# Patient Record
Sex: Female | Born: 1995 | Race: Black or African American | Hispanic: No | Marital: Single | Smoking: Former smoker
Health system: Southern US, Community
[De-identification: ages and names within clinical notes are randomized; demographics above are authoritative.]

## PROBLEM LIST (undated history)

## (undated) ENCOUNTER — Inpatient Hospital Stay (HOSPITAL_COMMUNITY): Payer: Self-pay

## (undated) DIAGNOSIS — J45909 Unspecified asthma, uncomplicated: Secondary | ICD-10-CM

## (undated) DIAGNOSIS — F419 Anxiety disorder, unspecified: Secondary | ICD-10-CM

## (undated) HISTORY — PX: DENTAL EXAMINATION UNDER ANESTHESIA: SHX1447

## (undated) HISTORY — PX: WISDOM TOOTH EXTRACTION: SHX21

---
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
## 2013-04-04 ENCOUNTER — Encounter (HOSPITAL_COMMUNITY): Payer: Self-pay | Admitting: *Deleted

## 2013-08-07 IMAGING — US US OB COMP LESS 14 WK
1 series · 13 of 28 positions shown · non-contrast
Comparison: none

[Series 1: us ob comp less 14 wks · 41 acquisitions, 13 frames shown]
[im 2/41]
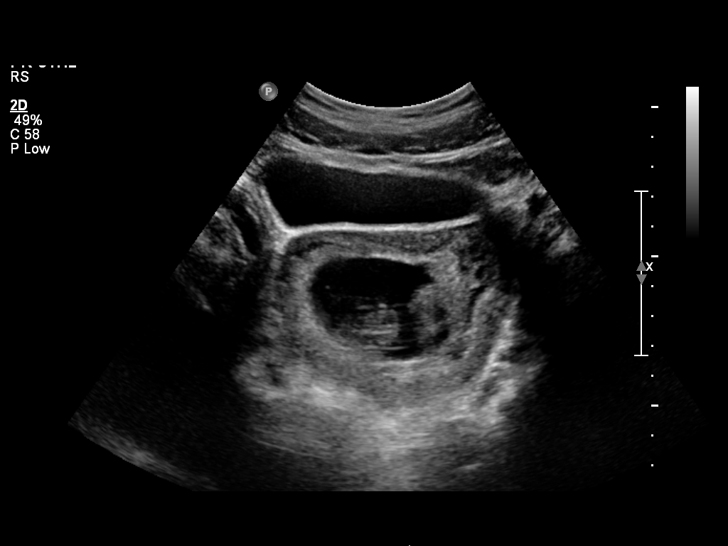
[im 5/41]
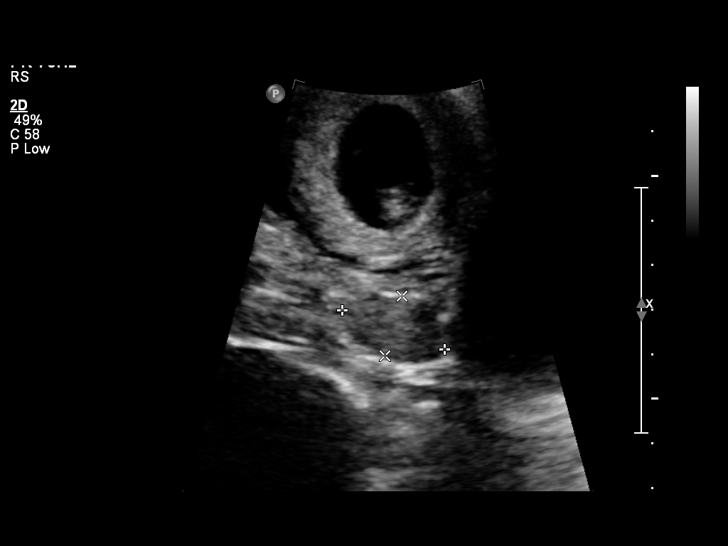
[im 8/41]
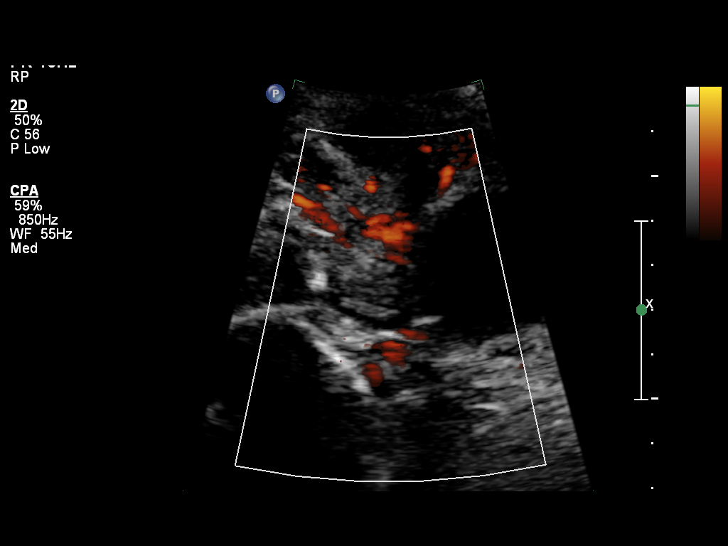
[im 11/41]
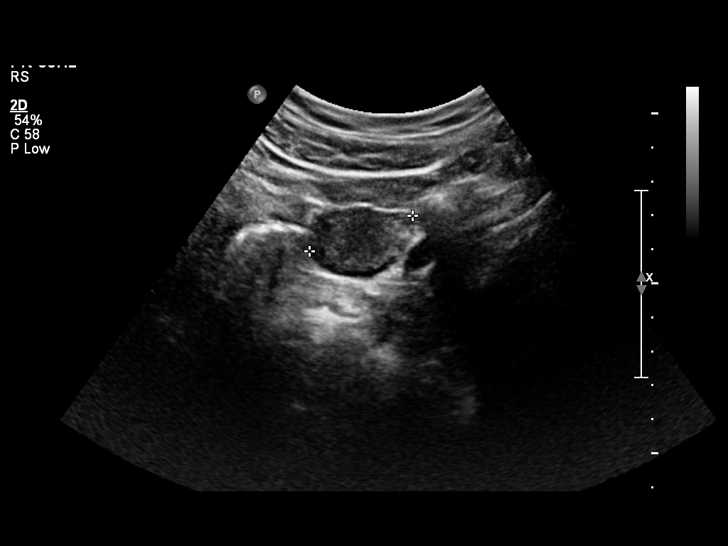
[im 14/41]
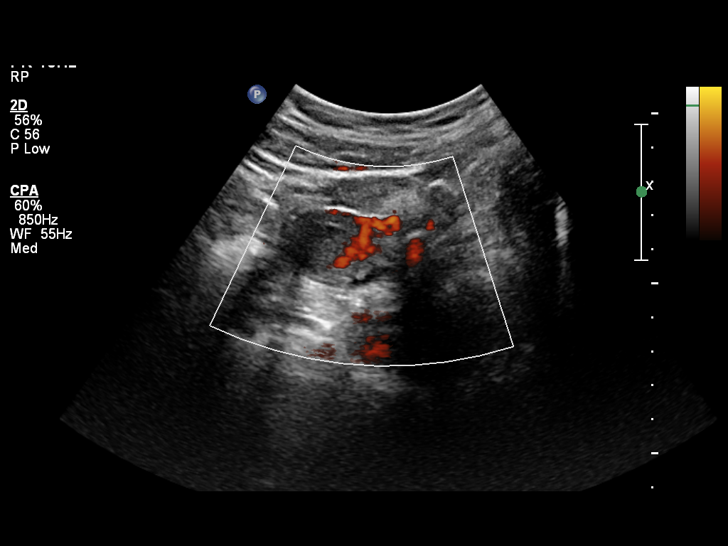
[im 17/41]
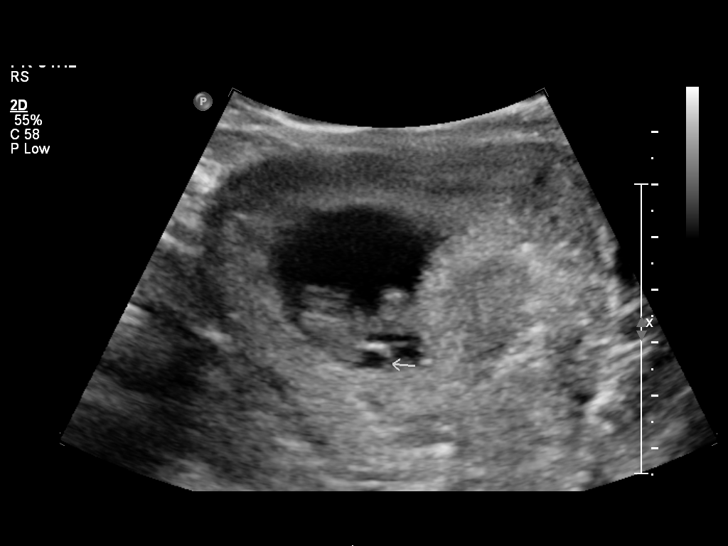
[im 21/41]
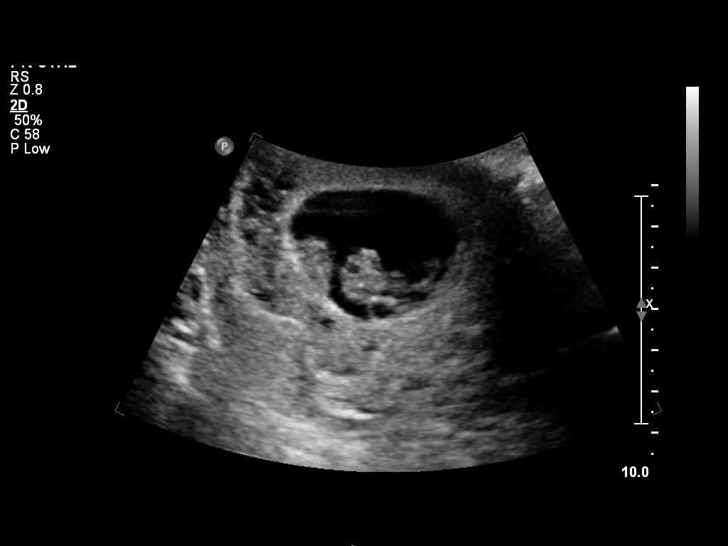
[im 24/41]
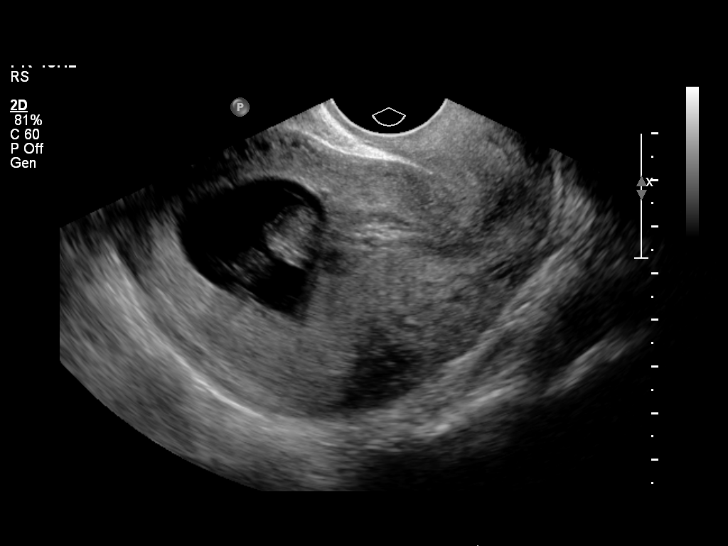
[im 27/41]
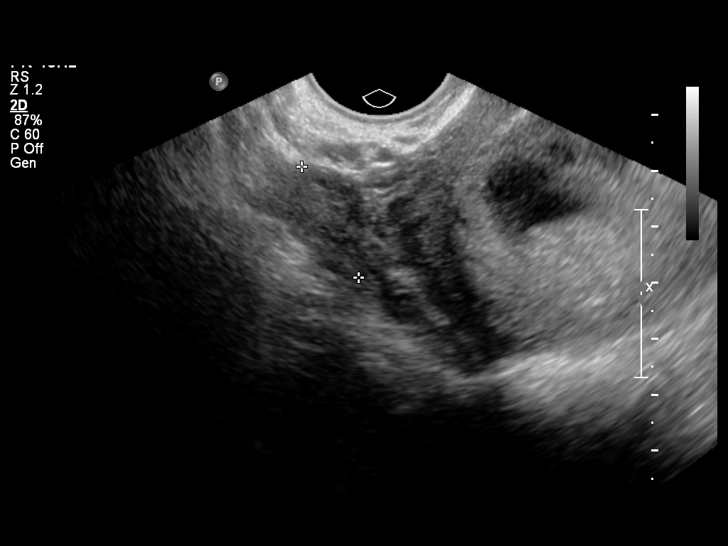
[im 30/41]
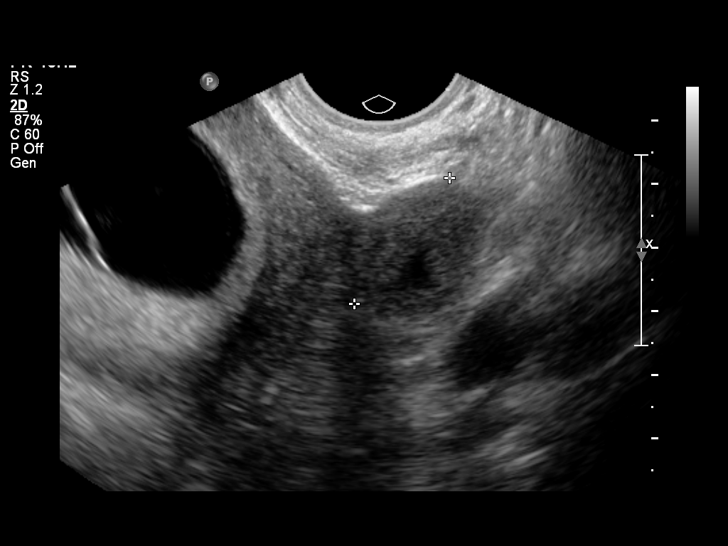
[im 33/41]
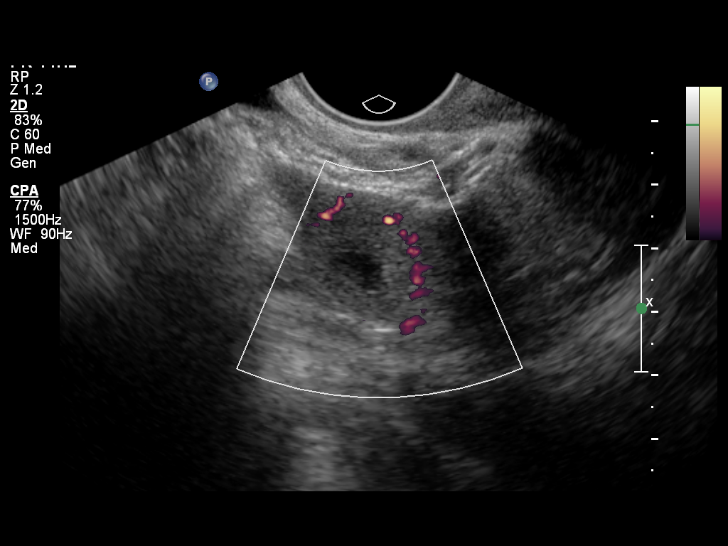
[im 36/41]
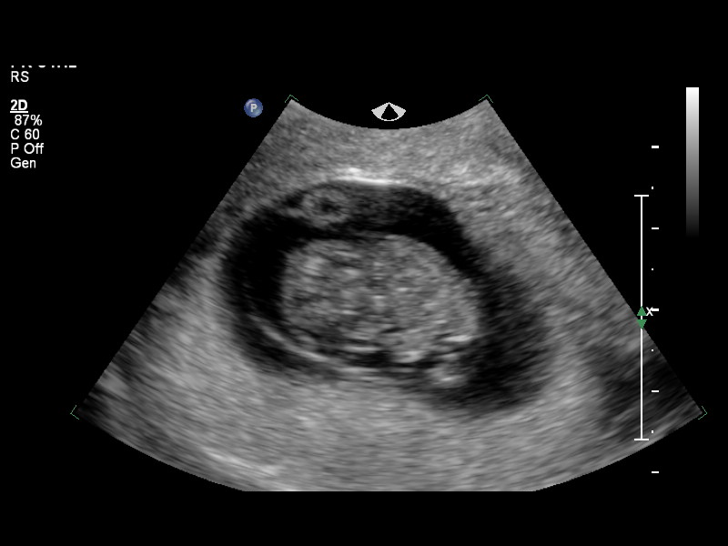
[im 39/41]
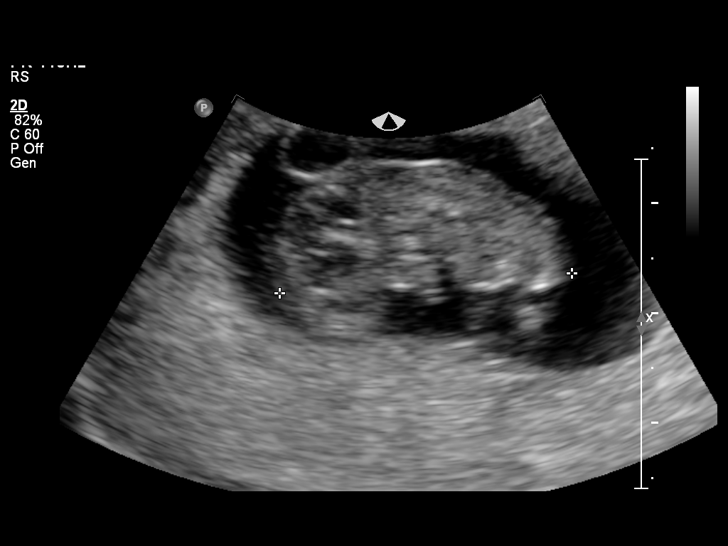

[13 of 28 positions shown; findings below may reference images not displayed]

OBSTETRICS REPORT
                    (Corrected Final 05/31/2012 [DATE])

Service(s) Provided

 US OB COMP LESS 14 WKS                                76801.0
 US OB TRANSVAGINAL                                    76817.0
Indications

 Uncertain LMP;  Establish Gestational [AGE]
 Pregnancy with inconclusive fetal viability
Fetal Evaluation

 Num Of Fetuses:    1
 Preg. Location:    Intrauterine
 Gest. Sac:         Intrauterine
 Yolk Sac:          Visualized
 Fetal Pole:        Visualized
 Fetal Heart Rate:  181                          bpm
 Cardiac Activity:  Observed
Biometry

 CRL:     27.7  mm     G. Age:  9w 3d                  EDD:    12/30/12
Gestational Age

 Best:          9w 3d      Det. By:  U/S C R L (05/30/12)     EDD:   12/30/12
Cervix Uterus Adnexa

 Cervix:       Normal appearance by transabdominal scan.
 Uterus:       No abnormality visualized.
 Left Ovary:    Size(cm) L: 3.35 x W: 3.19 x H: 2.06  Volume(cc):
                11.5- Small corpus luteum noted.
 Right Ovary:   Size(cm) L: 2.46 x W: 2.16 x H: 1.4  Volume(cc):
 Adnexa:     No abnormality visualized.
Impression

 Single living IUP with US Gest. Age of 9w 3d, and EDD of
 12/30/2012.
 No significant maternal uterine or adnexal abnormality
 identified.
 questions or concerns.
                 Attending Physician, ALAIN DANIEL

## 2014-03-04 ENCOUNTER — Encounter (HOSPITAL_COMMUNITY): Payer: Self-pay | Admitting: *Deleted

## 2018-01-16 ENCOUNTER — Inpatient Hospital Stay (HOSPITAL_COMMUNITY): Payer: 59

## 2018-01-16 ENCOUNTER — Encounter: Payer: Self-pay | Admitting: Obstetrics and Gynecology

## 2018-01-16 ENCOUNTER — Inpatient Hospital Stay (HOSPITAL_COMMUNITY)
Admission: AD | Admit: 2018-01-16 | Discharge: 2018-01-16 | Disposition: A | Discharge status: Home / Self Care | Payer: 59 | Source: Ambulatory Visit | Attending: Obstetrics & Gynecology | Admitting: Obstetrics & Gynecology

## 2018-01-16 ENCOUNTER — Other Ambulatory Visit: Payer: Self-pay | Admitting: Obstetrics and Gynecology

## 2018-01-16 ENCOUNTER — Encounter (HOSPITAL_COMMUNITY): Payer: Self-pay | Admitting: *Deleted

## 2018-01-16 DIAGNOSIS — N760 Acute vaginitis: Secondary | ICD-10-CM

## 2018-01-16 DIAGNOSIS — Z87891 Personal history of nicotine dependence: Secondary | ICD-10-CM | POA: Diagnosis not present

## 2018-01-16 DIAGNOSIS — O209 Hemorrhage in early pregnancy, unspecified: Secondary | ICD-10-CM | POA: Insufficient documentation

## 2018-01-16 DIAGNOSIS — B9689 Other specified bacterial agents as the cause of diseases classified elsewhere: Secondary | ICD-10-CM

## 2018-01-16 DIAGNOSIS — O98311 Other infections with a predominantly sexual mode of transmission complicating pregnancy, first trimester: Secondary | ICD-10-CM | POA: Insufficient documentation

## 2018-01-16 DIAGNOSIS — O3680X0 Pregnancy with inconclusive fetal viability, not applicable or unspecified: Secondary | ICD-10-CM | POA: Insufficient documentation

## 2018-01-16 DIAGNOSIS — A5901 Trichomonal vulvovaginitis: Secondary | ICD-10-CM | POA: Diagnosis not present

## 2018-01-16 DIAGNOSIS — O23591 Infection of other part of genital tract in pregnancy, first trimester: Secondary | ICD-10-CM | POA: Insufficient documentation

## 2018-01-16 DIAGNOSIS — O283 Abnormal ultrasonic finding on antenatal screening of mother: Secondary | ICD-10-CM

## 2018-01-16 DIAGNOSIS — N939 Abnormal uterine and vaginal bleeding, unspecified: Secondary | ICD-10-CM | POA: Diagnosis present

## 2018-01-16 DIAGNOSIS — Z3A01 Less than 8 weeks gestation of pregnancy: Secondary | ICD-10-CM | POA: Insufficient documentation

## 2018-01-16 LAB — WET PREP, GENITAL
SPERM: NONE SEEN
Yeast Wet Prep HPF POC: NONE SEEN

## 2018-01-16 LAB — CBC
HEMATOCRIT: 37.6 % (ref 36.0–46.0)
Hemoglobin: 12.3 g/dL (ref 12.0–15.0)
MCH: 28.9 pg (ref 26.0–34.0)
MCHC: 32.7 g/dL (ref 30.0–36.0)
MCV: 88.5 fL (ref 78.0–100.0)
Platelets: 336 10*3/uL (ref 150–400)
RBC: 4.25 MIL/uL (ref 3.87–5.11)
RDW: 15.6 % — ABNORMAL HIGH (ref 11.5–15.5)
WBC: 9.1 10*3/uL (ref 4.0–10.5)

## 2018-01-16 LAB — POCT PREGNANCY, URINE: Preg Test, Ur: POSITIVE — AB

## 2018-01-16 LAB — URINALYSIS, ROUTINE W REFLEX MICROSCOPIC
BACTERIA UA: NONE SEEN
Bilirubin Urine: NEGATIVE
Glucose, UA: NEGATIVE mg/dL
Ketones, ur: NEGATIVE mg/dL
NITRITE: NEGATIVE
PH: 8 (ref 5.0–8.0)
Protein, ur: >=300 mg/dL — AB
RBC / HPF: >50 RBC/hpf — ABNORMAL HIGH (ref 0–5)
SPECIFIC GRAVITY, URINE: 1.02 (ref 1.005–1.030)

## 2018-01-16 LAB — HCG, QUANTITATIVE, PREGNANCY: hCG, Beta Chain, Quant, S: 49989 m[IU]/mL — ABNORMAL HIGH (ref <5)

## 2018-01-16 MED ORDER — METRONIDAZOLE 500 MG PO TABS
2000.0000 mg | ORAL_TABLET | Freq: Once | ORAL | Status: AC
  Administered 2018-01-16: 2000 mg via ORAL
  Filled 2018-01-16: qty 4

## 2018-01-16 NOTE — Discharge Instructions (Signed)
Expedited Partner Therapy:  °Information Sheet for Patients and Partners  °            ° °You have been offered expedited partner therapy (EPT). This information sheet contains important information and warnings you need to be aware of, so please read it carefully.  ° °Expedited Partner Therapy (EPT) is the clinical practice of treating the sexual partners of persons who receive chlamydia, gonorrhea, or trichomoniasis diagnoses by providing medications or prescriptions to the patient. Patients then provide partners with these therapies without the health-care provider having examined the partner. In other words, EPT is a convenient, fast and private way for patients to help their sexual partners get treated.  ° °Chlamydia and gonorrhea are bacterial infections you get from having sex with a person who is already infected. Trichomoniasis (or “trich”) is a very common sexually transmitted infection (STI) that is caused by infection with a protozoan parasite called Trichomonas vaginalis.  Many people with these infections don’t know it because they feel fine, but without treatment these infections can cause serious health problems, such as pelvic inflammatory disease, ectopic pregnancy, infertility and increased risk of HIV.  ° °It is important to get treated as soon as possible to protect your health, to avoid spreading these infections to others, and to prevent yourself from becoming re-infected. The good news is these infections can be easily cured with proper antibiotic medicine. The best way to take care of your self is to see a doctor or go to your local health department. If you are not able to see a doctor or other medical provider, you should take EPT.  ° ° °Recommended Medication: °EPT for Chlamydia:  Azithromycin (Zithromax) 1 gram orally in a single dose °EPT for Gonorrhea:  Cefixime (Suprax) 400 milligrams orally in a single dose PLUS azithromycin (Zithromax) 1 gram orally in a single dose °EPT for  Trichomoniasis:  Metronidazole (Flagyl) 2 grams orally in a single dose ° ° °These medicines are very safe. However, you should not take them if you have ever had an allergic reaction (like a rash) to any of these medicines: azithromycin (Zithromax), erythromycin, clarithromycin (Biaxin), metronidazole (Flagyl), tinidazole (Tindimax). If you are uncertain about whether you have an allergy, call your medical provider or pharmacist before taking this medicine. If you have a serious, long-term illness like kidney, liver or heart disease, colitis or stomach problems, or you are currently taking other prescription medication, talk to your provider before taking this medication.  ° °Women: If you have lower belly pain, pain during sex, vomiting, or a fever, do not take this medicine. Instead, you should see a medical provider to be certain you do not have pelvic inflammatory disease (PID). PID can be serious and lead to infertility, pregnancy problems or chronic pelvic pain.  ° °Pregnant Women: It is very important for you to see a doctor to get pregnancy services and pre-natal care. These antibiotics for EPT are safe for pregnant women, but you still need to see a medical provider as soon as possible. It is also important to note that Doxycycline is an alternative therapy for chlamydia, but it should not be taken by someone who is pregnant.  ° °Men: If you have pain or swelling in the testicles or a fever, do not take this medicine and see a medical provider.    ° °Men who have sex with men (MSM): MSM in [place] continue to experience high rates of syphilis and HIV. Many MSM with gonorrhea or   chlamydia could also have syphilis and/or HIV and not know it. If you are a man who has sex with other men, it is very important that you see a medical provider and are tested for HIV and syphilis. EPT is not recommended for gonorrhea for MSM.  Recommended treatment for gonorrhea for MSM is Rocephin (shot) AND azithromycin  due to decreased cure rate.  Please see your medical provider if this is the case.   ° °Along with this information sheet is a prescription for the medicine. If you receive a prescription it will be in your name and will indicate your date of birth, or it will be in the name of “Expedited Partner Therapy”.   In either case, you can have the prescription filled at a pharmacy. You will be responsible for the cost of the medicine, unless you have prescription drug coverage. In that case, you could provide your name so the pharmacy could bill your health plan.  ° °Take the medication as directed. Some people will have a mild, upset stomach, which does not last long. AVOID alcohol 24 hours after taking metronidazole (Flagyl) to reduce the possibility of a disulfiram-like reaction (severe vomiting and abdominal pain).  After taking the medicine, do not have sex for 7 days. Do not share this medicine or give it to anyone else. It is important to tell everyone you have had sex with in the last 60 days that they need to go and get tested for sexually transmitted infections.  ° °Ways to prevent these and other sexually transmitted infections (STIs):  ° °• Abstain from sex. This is the only sure way to avoid getting an STI.  °• Use barrier methods, such as condoms, consistently and correctly.  °• Limit the number of sexual partners.  °• Have regular physical exams, including testing for STIs.  ° °For more information about EPT or other issues pertaining to an STI, please contact your medical provider or the [place] [facility] at [phone] or [email or web address]   ° °

## 2018-01-16 NOTE — MAU Note (Signed)
Patient reports waking up in the middle of the night "in a pool of blood" and cramping.  States passing several dime sized clots as well as a few larger ones.  LMP was 12/03/17.  Pt reports taking a +HPT.

## 2018-01-16 NOTE — MAU Provider Note (Signed)
History     CSN: [id number]  Arrival date and time: 01/16/18 4098   First Provider Initiated Contact with Patient 01/16/18 1010      Chief Complaint  Patient presents with  . Vaginal Bleeding  . Abdominal Pain   HPI  Ms.  Maureen Pierce is a 22 y.o. year old G15P0010 female at [redacted]w[redacted]d weeks gestation who presents to MAU reporting awakening in the middle of the night "in a pool of blood" and cramping. She reports passing several dime sized clots as well as a few larger ones. LMP was 12/03/17. She reports she had 3 (+) HPTs.  Past Medical History:  Diagnosis Date  . Anxiety   . Asthma     Past Surgical History:  Procedure Laterality Date  . DENTAL EXAMINATION UNDER ANESTHESIA    . WISDOM TOOTH EXTRACTION      Family History  Problem Relation Age of Onset  . Other Neg Hx     Social History   Tobacco Use  . Smoking status: Former Smoker    Types: Cigarettes  . Smokeless tobacco: Never Used  Substance Use Topics  . Alcohol use: Yes    Comment: occasionally  . Drug use: No    Allergies: No Known Allergies  Medications Prior to Admission  Medication Sig Dispense Refill Last Dose  . Ascorbic Acid (VITAMIN C PO) Take 1 tablet by mouth daily.   05/29/2012 at Unknown  . fexofenadine (ALLEGRA) 180 MG tablet Take 180 mg by mouth daily as needed. For allergies   Past Week at Unknown  . metoCLOPramide (REGLAN) 10 MG tablet Take 1 tablet (10 mg total) by mouth 4 (four) times daily. 60 tablet 1   . metroNIDAZOLE (FLAGYL) 500 MG tablet Take 1 tablet (500 mg total) by mouth 2 (two) times daily. 14 tablet 0     Review of Systems  Constitutional: Negative.   HENT: Negative.   Eyes: Negative.   Respiratory: Negative.   Cardiovascular: Negative.   Gastrointestinal: Positive for abdominal pain.  Endocrine: Negative.   Genitourinary: Positive for vaginal bleeding.  Musculoskeletal: Negative.   Skin: Negative.   Allergic/Immunologic: Negative.   Neurological: Negative.    Hematological: Negative.   Psychiatric/Behavioral: Negative.    Physical Exam   Blood pressure 140/86, pulse 100, temperature 98.5 F (36.9 C), temperature source Oral, resp. rate 18, weight 84.4 kg, last menstrual period 12/03/2017.  Physical Exam  Nursing note and vitals reviewed. Constitutional: She is oriented to person, place, and time. She appears well-developed and well-nourished.  HENT:  Head: Normocephalic and atraumatic.  Eyes: Pupils are equal, round, and reactive to light.  Neck: Normal range of motion.  Cardiovascular: Normal rate, regular rhythm, normal heart sounds and intact distal pulses.  Respiratory: Effort normal and breath sounds normal.  GI: Soft. Bowel sounds are normal.  Genitourinary:  Genitourinary Comments: Uterus: non-tender, SE: cervix is smooth, pink, no lesions, small amt of dark, red blood in vaginal vault -- WP, GC/CT done, closed/long/firm, no CMT or friability, no adnexal tenderness   Musculoskeletal: Normal range of motion.  Neurological: She is alert and oriented to person, place, and time.  Skin: Skin is warm and dry.  Psychiatric: She has a normal mood and affect. Her behavior is normal. Judgment and thought content normal.    MAU Course  Procedures  MDM [facility] UPT CBC w/Diff ABO/Rh HCG Wet Prep GC/CT -- pending HIV -- pending OB < 14 wks [place] with TV Flagyl 2000 mg po prior to d/c --  Results for orders placed or performed during the hospital encounter of 01/16/18 (from the past 24 hour(s))  Urinalysis, Routine w reflex microscopic     Status: Abnormal   Collection Time: 01/16/18  9:28 AM  Result Value Ref Range   Color, Urine YELLOW YELLOW   APPearance CLOUDY (A) CLEAR   Specific Gravity, Urine 1.020 1.005 - 1.030   pH 8.0 5.0 - 8.0   Glucose, UA NEGATIVE NEGATIVE mg/dL   Hgb urine dipstick LARGE (A) NEGATIVE   Bilirubin Urine NEGATIVE NEGATIVE   Ketones, ur NEGATIVE NEGATIVE mg/dL   Protein, ur >=161>=300 (A) NEGATIVE mg/dL    Nitrite NEGATIVE NEGATIVE   Leukocytes, UA TRACE (A) NEGATIVE   RBC / HPF >50 (H) 0 - 5 RBC/hpf   WBC, UA 6-10 0 - 5 WBC/hpf   Bacteria, UA NONE SEEN NONE SEEN   Squamous Epithelial / LPF 21-50 0 - 5   Mucus PRESENT   Pregnancy, urine POC     Status: Abnormal   Collection Time: 01/16/18  9:36 AM  Result Value Ref Range   Preg Test, Ur POSITIVE (A) NEGATIVE  CBC     Status: Abnormal   Collection Time: 01/16/18 10:28 AM  Result Value Ref Range   WBC 9.1 4.0 - 10.5 K/uL   RBC 4.25 3.87 - 5.11 MIL/uL   Hemoglobin 12.3 12.0 - 15.0 g/dL   HCT 09.637.6 04.536.0 - 40.946.0 %   MCV 88.5 78.0 - 100.0 fL   MCH 28.9 26.0 - 34.0 pg   MCHC 32.7 30.0 - 36.0 g/dL   RDW 81.115.6 (H) 91.411.5 - 78.215.5 %   Platelets 336 150 - 400 K/uL  hCG, quantitative, pregnancy     Status: Abnormal   Collection Time: 01/16/18 10:28 AM  Result Value Ref Range   hCG, Beta Chain, Quant, S 49,989 (H) <5 mIU/mL  Wet prep, genital     Status: Abnormal   Collection Time: 01/16/18 11:01 AM  Result Value Ref Range   Yeast Wet Prep HPF POC NONE SEEN NONE SEEN   Trich, Wet Prep PRESENT (A) NONE SEEN   Clue Cells Wet Prep HPF POC PRESENT (A) NONE SEEN   WBC, Wet Prep HPF POC FEW (A) NONE SEEN   Sperm NONE SEEN     [place] Ob Less Than 14 Weeks With Ob Transvaginal  Result Date: 01/16/2018 CLINICAL DATA:  Vaginal bleeding. Threatened abortion. Gestational age by LMP of 6 weeks 2 days. EXAM: OBSTETRIC <14 WK US AND TRANSVAGINAL OB US TECHNIQUE: Both transabdominal and transvaginal ultrasound examinations were performed for complete evaluation of the gestation as well as the maternal uterus, adnexal regions, and pelvic cul-de-sac. Transvaginal technique was performed to assess early pregnancy. COMPARISON:  None. FINDINGS: Intrauterine gestational sac: None; thickened endometrium is seen measuring 2.2 cm. No fibroids identified. Subchorionic hemorrhage:  None visualized. Maternal uterus/adnexae: 2.4 cm benign simple appearing cyst involving the right  ovary. Normal appearance of the left ovary. Tiny amount of simple free fluid in pelvic cul-de-sac. IMPRESSION: Pregnancy location not visualized sonographically. Differential diagnosis includes recent spontaneous abortion, IUP too early to visualize, and non-visualized ectopic pregnancy. Recommend close follow up of quantitative B-HCG levels, and follow up US as clinically warranted. Electronically Signed   By: Myles RosenthalJohn  Stahl M.D.   On: 01/16/2018 12:31     Assessment and Plan  Pregnancy of unknown anatomic location - F/U HCG scheduled for [facility] on 01/18/18 @ 1100 - Information provided on ectopic pregnancy   Bleeding in early  pregnancy  - Information provided on threatened miscarriage, VB in pregnancy   Bacterial vaginitis - Treated with Flagyl prior d/c - Information provided on BV   Trichomonal vaginitis during pregnancy in first trimester - Treated with Flagyl 2000 mg prior to d/c - Information provided on trichomonas   - Discharge patient - Patient verbalized an understanding of the plan of care and agrees.    Raelyn Mora, MSN, CNM 01/16/2018, 10:21 AM

## 2018-01-17 LAB — GC/CHLAMYDIA PROBE AMP (~~LOC~~) NOT AT ARMC
Chlamydia: NEGATIVE
Neisseria Gonorrhea: NEGATIVE

## 2018-01-17 LAB — HIV ANTIBODY (ROUTINE TESTING W REFLEX): HIV SCREEN 4TH GENERATION: NONREACTIVE

## 2018-01-18 ENCOUNTER — Ambulatory Visit (INDEPENDENT_AMBULATORY_CARE_PROVIDER_SITE_OTHER): Payer: 59 | Admitting: *Deleted

## 2018-01-18 DIAGNOSIS — O3680X0 Pregnancy with inconclusive fetal viability, not applicable or unspecified: Secondary | ICD-10-CM

## 2018-01-18 DIAGNOSIS — O283 Abnormal ultrasonic finding on antenatal screening of mother: Secondary | ICD-10-CM

## 2018-01-18 LAB — HCG, QUANTITATIVE, PREGNANCY: hCG, Beta Chain, Quant, S: 12448 m[IU]/mL — ABNORMAL HIGH (ref <5)

## 2018-01-18 NOTE — Progress Notes (Addendum)
Here for stat bhcg,  States since she left MAU on Monday her bleeding has tapered and stopped  Except for scant brown discharge. Denies pain today.   Here for stat bhcg. Explained we will draw stat bhcg and have her wait in lobby for results which we will then discuss with provider and then her. She voices understanding.  Reviewed results with Dr. Earlene PlaterWallace and informed Maureen Pierce it appears she is having miscarriage due to significant drop in bhcg.  Patient teary, support given. Informed patient provider reccommends us in 2 weeks from last us and repeat bhcg in 2 weeks to confirm. She agrees to plan and voices understanding.   Attestation: I agree with nurse plan and documentation.   Cristal DeerLaurel S. Earlene PlaterWallace, DO OB/GYN Fellow

## 2018-01-30 ENCOUNTER — Ambulatory Visit (INDEPENDENT_AMBULATORY_CARE_PROVIDER_SITE_OTHER): Payer: 59 | Admitting: Family Medicine

## 2018-01-30 ENCOUNTER — Ambulatory Visit (HOSPITAL_COMMUNITY)
Admission: RE | Admit: 2018-01-30 | Discharge: 2018-01-30 | Disposition: A | Discharge status: Home / Self Care | Payer: 59 | Source: Ambulatory Visit | Attending: Family Medicine | Admitting: Family Medicine

## 2018-01-30 VITALS — BP 135/91 | HR 83 | Ht 63.0 in | Wt 181.0 lb

## 2018-01-30 DIAGNOSIS — O3680X0 Pregnancy with inconclusive fetal viability, not applicable or unspecified: Secondary | ICD-10-CM

## 2018-01-30 DIAGNOSIS — O034 Incomplete spontaneous abortion without complication: Secondary | ICD-10-CM

## 2018-01-30 DIAGNOSIS — O283 Abnormal ultrasonic finding on antenatal screening of mother: Secondary | ICD-10-CM | POA: Insufficient documentation

## 2018-01-30 MED ORDER — MISOPROSTOL 200 MCG PO TABS: ORAL_TABLET | ORAL | 1 refills | Status: AC

## 2018-01-30 NOTE — Progress Notes (Signed)
Patient seen for [place] follow up. Had SAB, still having vaginal bleeding. Quant decreased from 50k to 12,500.   BP (!) 135/91   Pulse 83   Ht 5\' 3"  (1.6 m)   Wt 181 lb (82.1 kg)   LMP 12/03/2017 (Exact Date)   BMI 32.06 kg/m  A&Ox3, NAD. Appropriately grieving. Lungs: nonlabored breathing Abd: nontender.   1. Retained products. Discussed imaging results. Rpt quant today. Will give cytotec and follow up in 2 weeks.

## 2018-01-31 LAB — BETA HCG QUANT (REF LAB): hCG Quant: 718 m[IU]/mL

## 2018-02-13 ENCOUNTER — Ambulatory Visit: Payer: 59 | Admitting: Advanced Practice Midwife

## 2018-12-02 ENCOUNTER — Encounter (HOSPITAL_COMMUNITY): Payer: Self-pay

## 2019-03-26 IMAGING — US US OB < 14 WEEKS - US OB TV
1 series · 15 of 28 positions shown · non-contrast
Comparison: None.

CLINICAL DATA: Vaginal bleeding. Threatened abortion. Gestational
age by LMP of 6 weeks 2 days.

EXAM:
OBSTETRIC <14 WK US AND TRANSVAGINAL OB US
TECHNIQUE: Both transabdominal and transvaginal ultrasound examinations were
performed for complete evaluation of the gestation as well as the
maternal uterus, adnexal regions, and pelvic cul-de-sac.
Transvaginal technique was performed to assess early pregnancy.

[Series 1: us ob < 14 weeks - us ob tv · 15 of 78 slices shown]
[im 1/78]
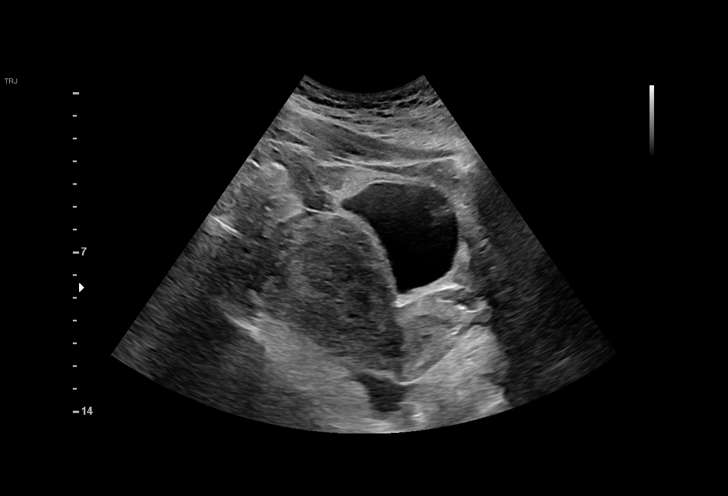
[im 6/78]
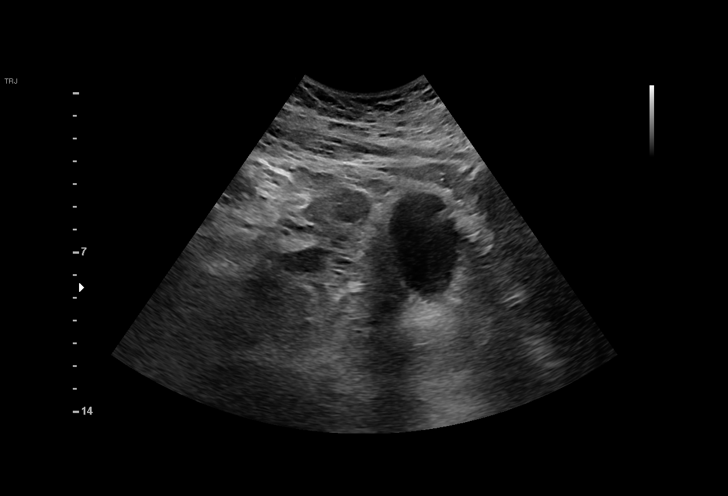
[im 12/78]
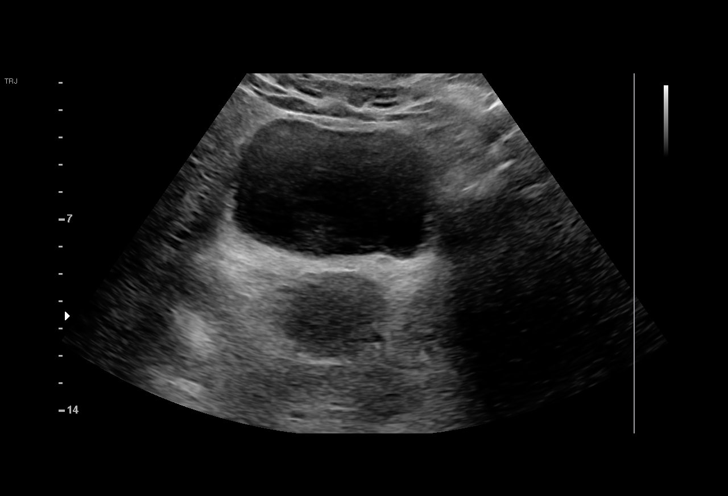
[im 18/78]
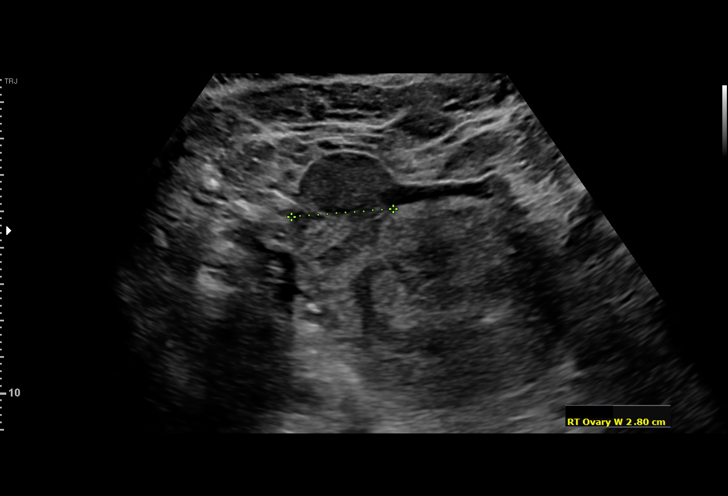
[im 23/78]
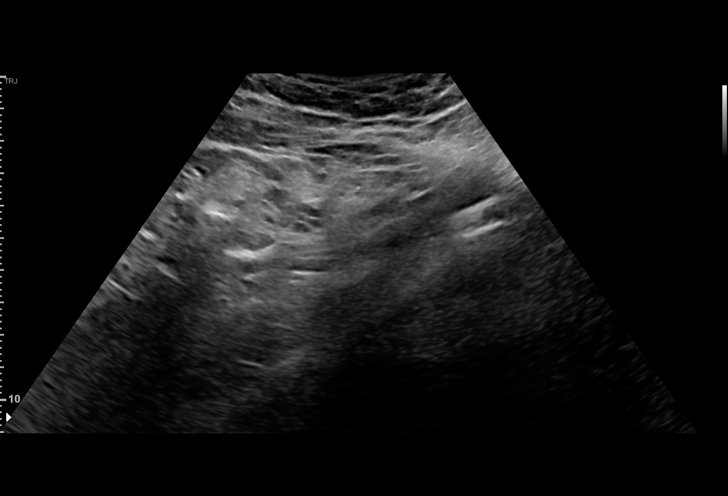
[im 29/78]
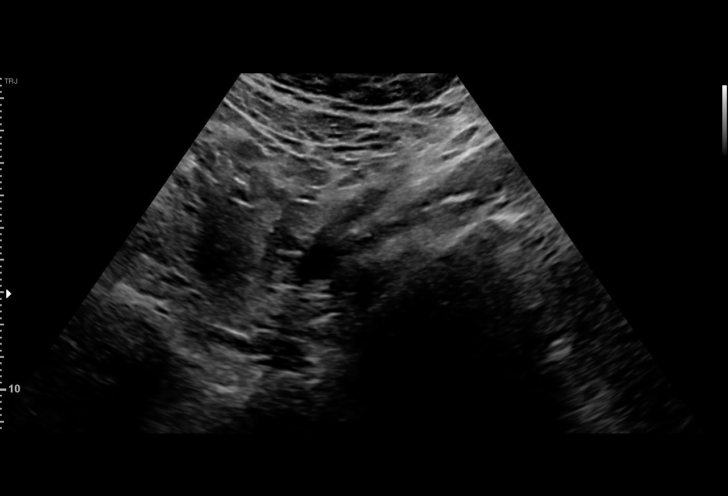
[im 35/78]
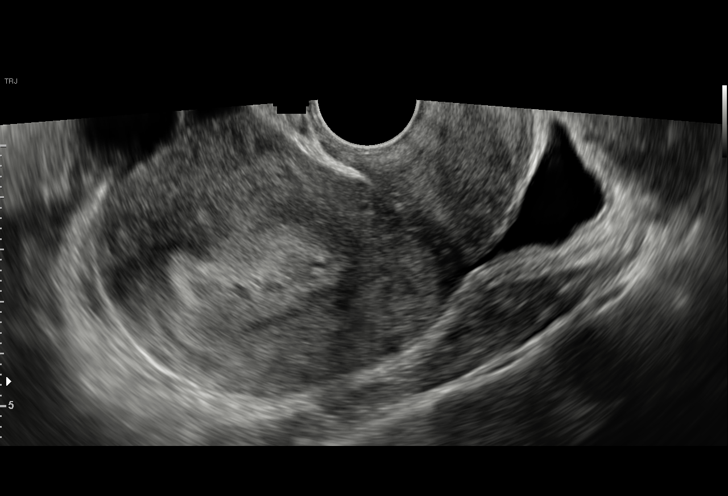
[im 40/78]
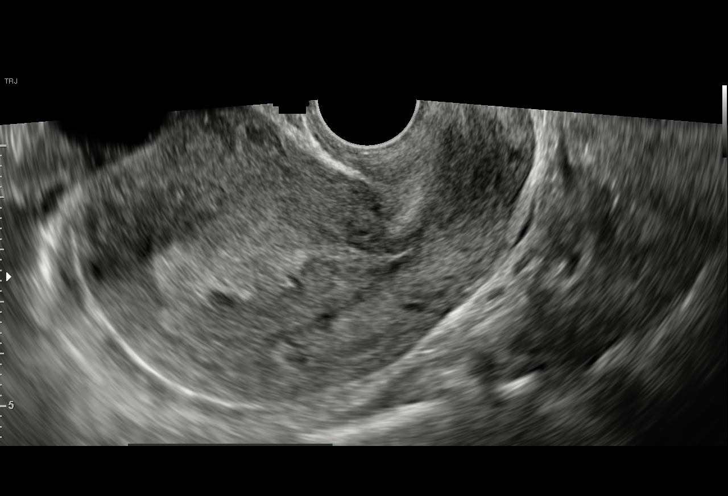
[im 43/78]
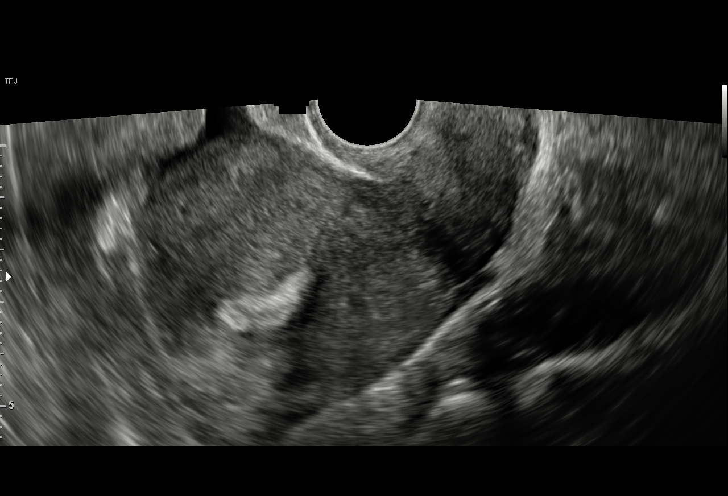
[im 49/78]
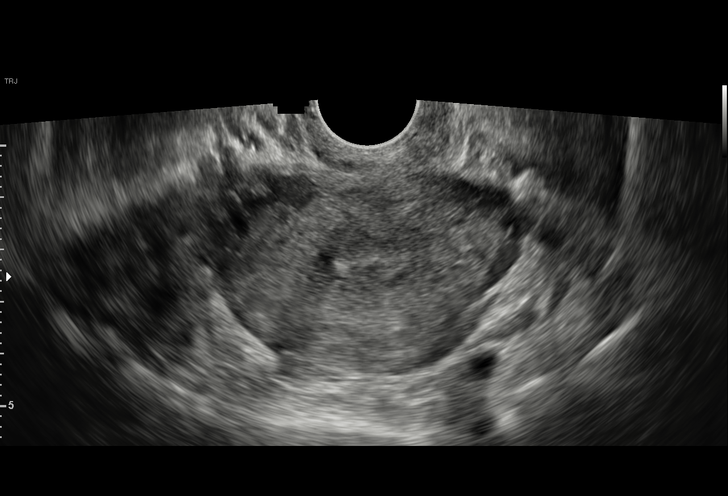
[im 55/78]
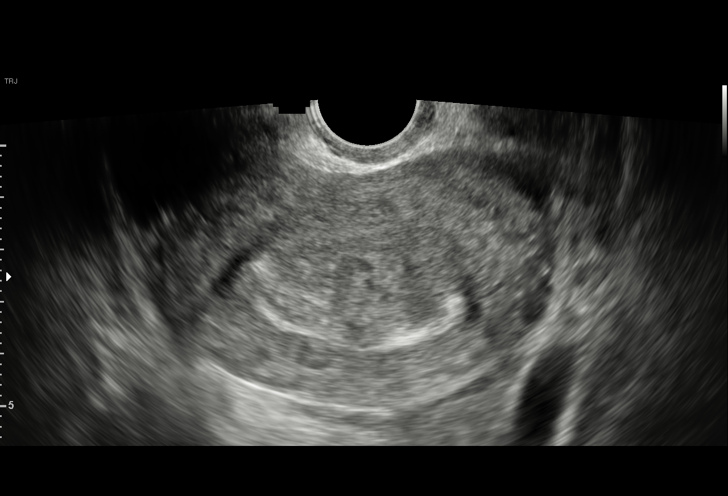
[im 60/78]
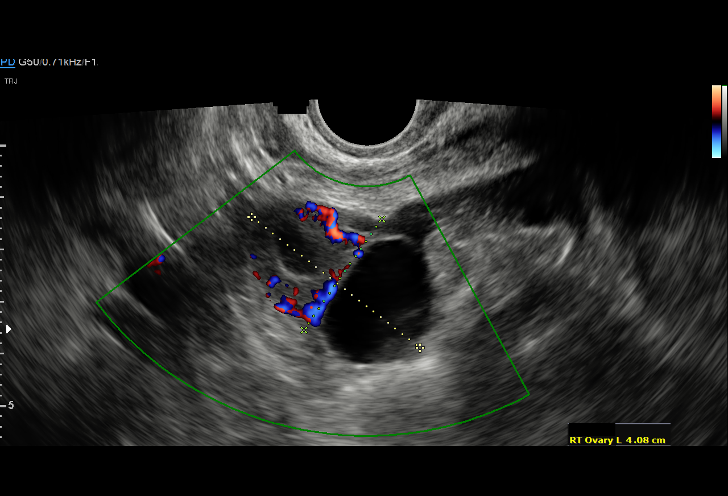
[im 66/78]
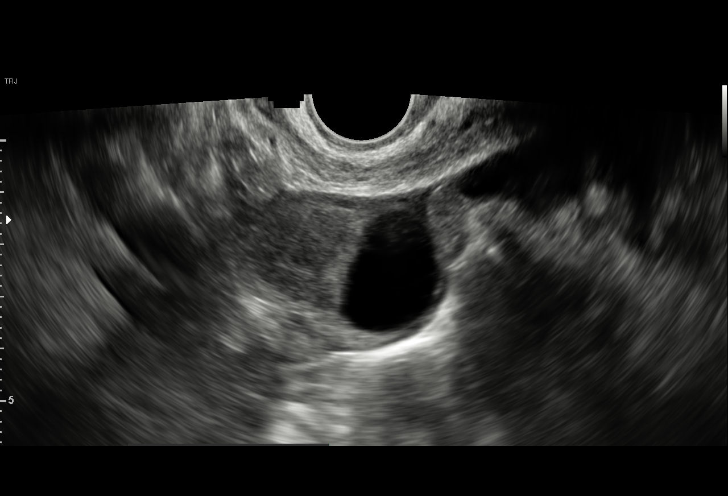
[im 72/78]
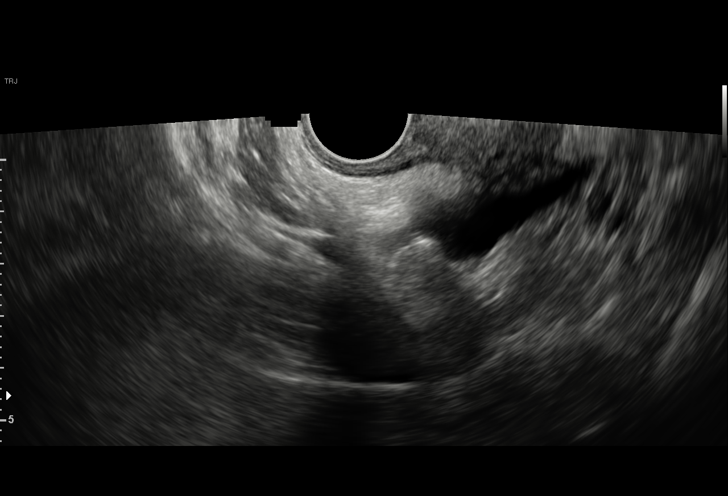
[im 78/78]
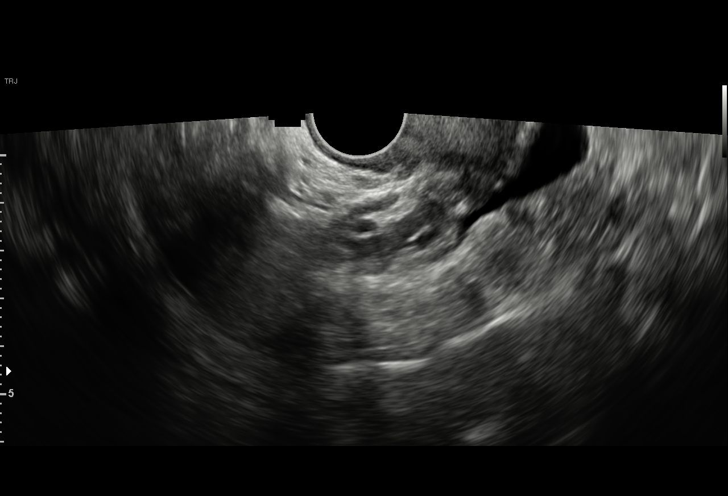

[15 of 28 positions shown; findings below may reference images not displayed]

FINDINGS: Intrauterine gestational sac: None; thickened endometrium is seen
measuring 2.2 cm. No fibroids identified.

Subchorionic hemorrhage:  None visualized.

Maternal uterus/adnexae: 2.4 cm benign simple appearing cyst
involving the right ovary. Normal appearance of the left ovary. Tiny
amount of simple free fluid in pelvic cul-de-sac.
IMPRESSION: Pregnancy location not visualized sonographically. Differential
diagnosis includes recent spontaneous abortion, IUP too early to
visualize, and non-visualized ectopic pregnancy. Recommend close
follow up of quantitative B-HCG levels, and follow up US as
clinically warranted.

## 2019-04-09 IMAGING — US US OB TRANSVAGINAL
1 series · 16 of 19 positions shown · non-contrast
Comparison: 01/16/2018

CLINICAL DATA: Viability, possible miscarriage

EXAM:
TRANSVAGINAL OB ULTRASOUND
TECHNIQUE: Transvaginal ultrasound was performed for complete evaluation of the
gestation as well as the maternal uterus, adnexal regions, and
pelvic cul-de-sac.

[Series 1: us ob transvaginal · 19 acquisitions, 16 frames shown]
[im 1/19]
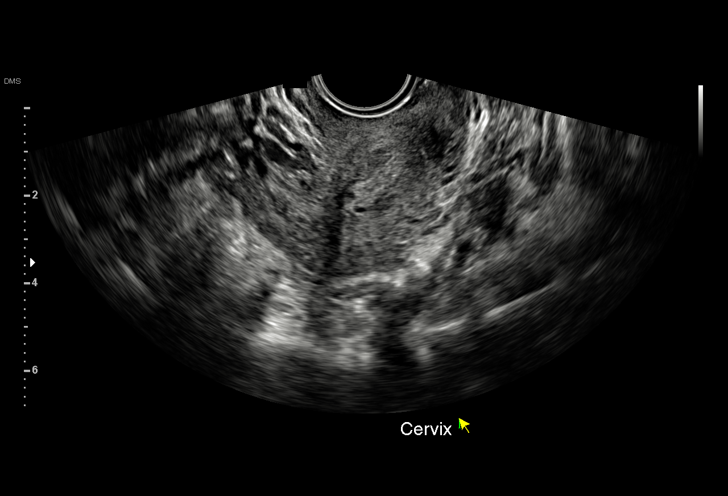
[im 2/19]
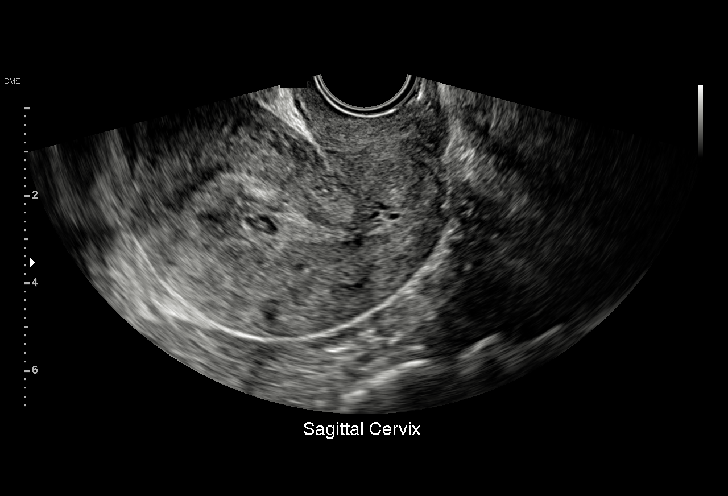
[im 3/19]
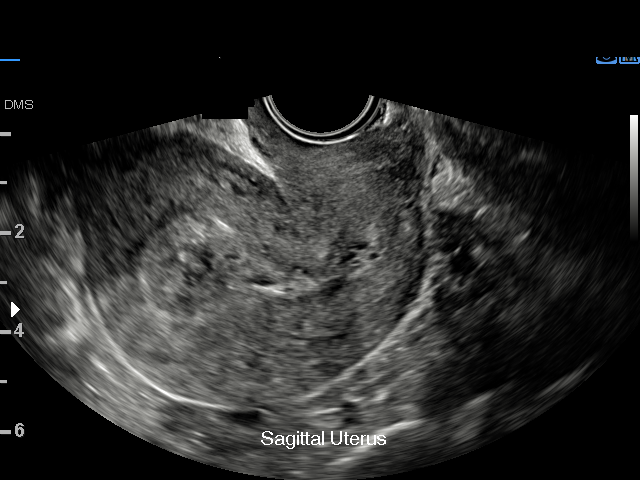
[im 5/19]
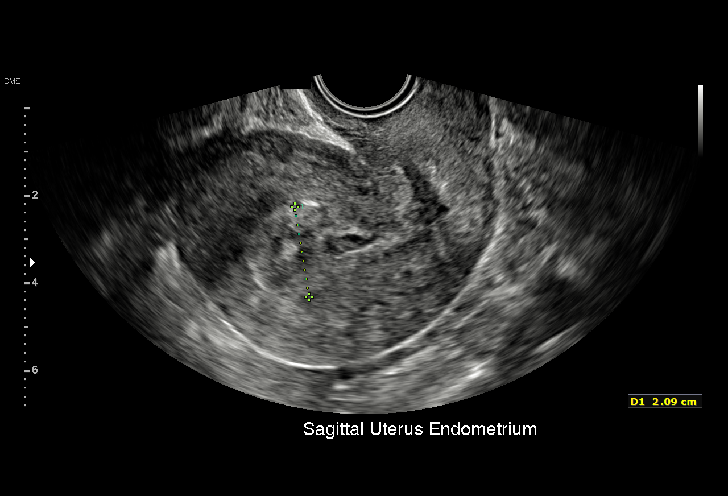
[im 6/19]
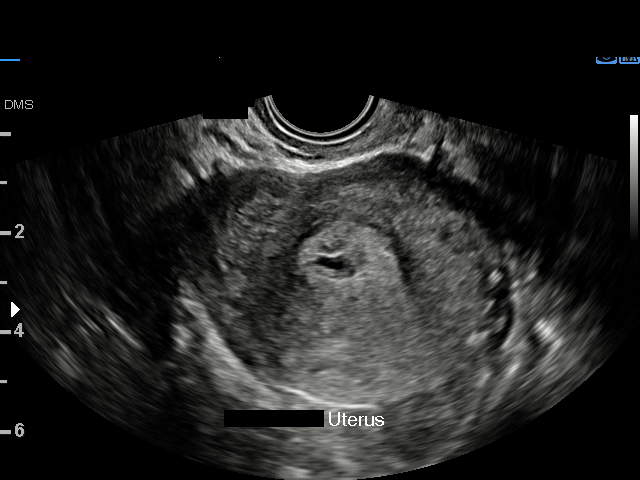
[im 7/19]
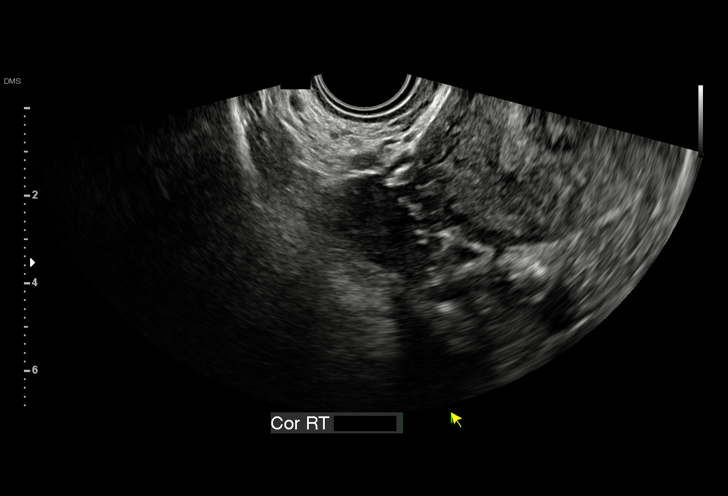
[im 8/19]
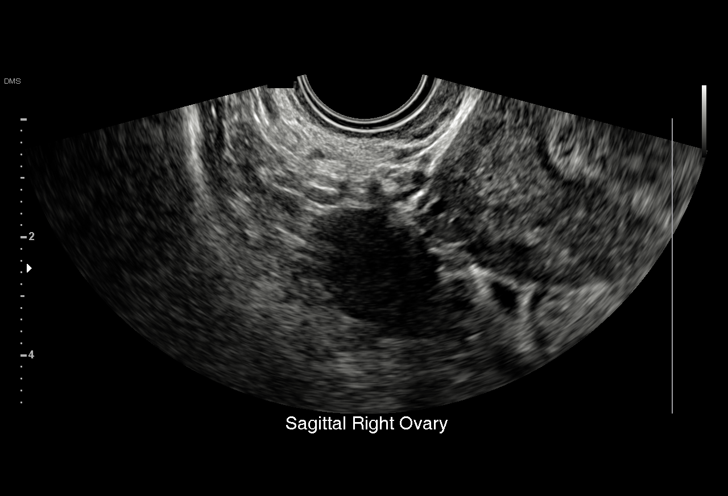
[im 9/19]
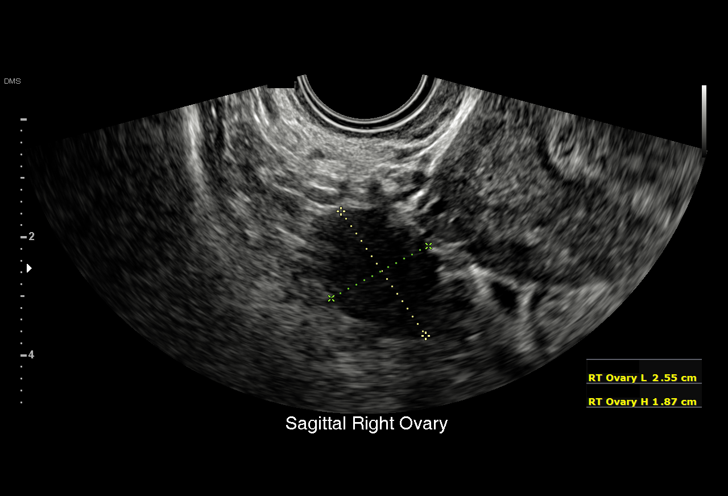
[im 11/19]
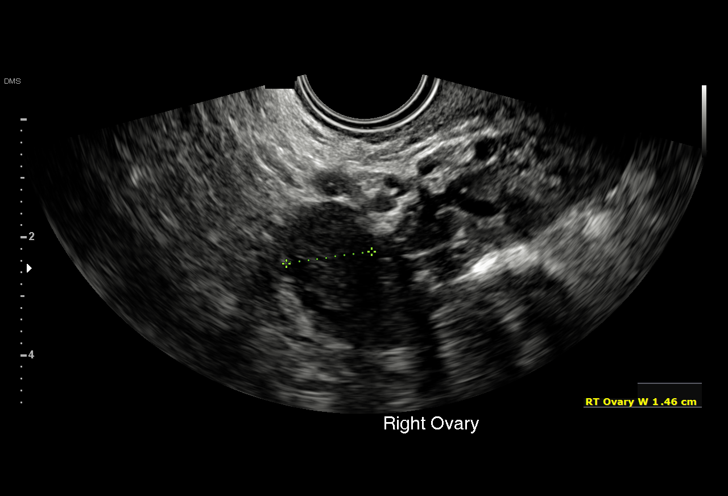
[im 12/19]
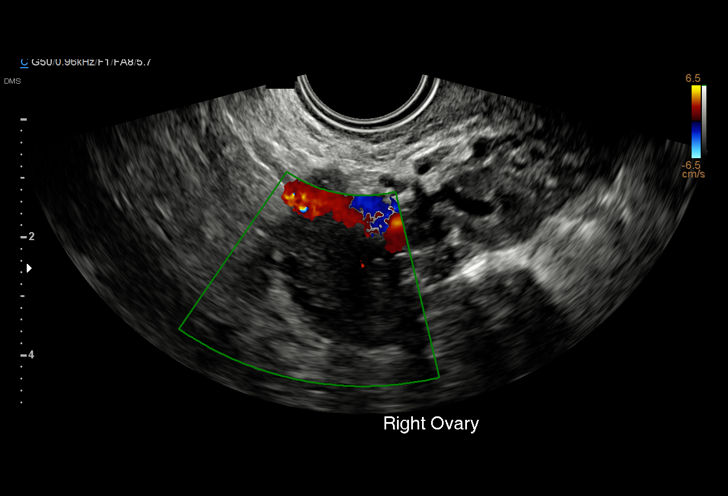
[im 13/19]
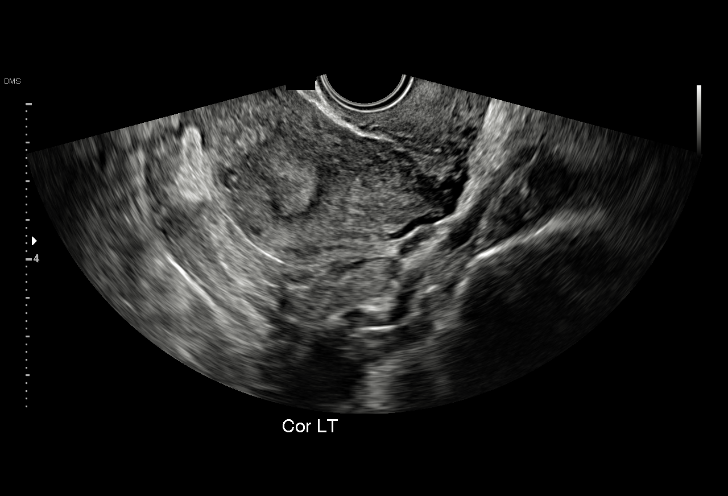
[im 14/19]
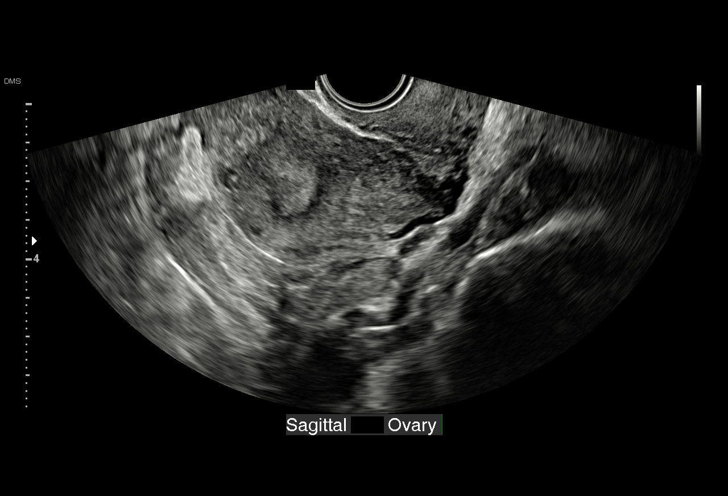
[im 15/19]
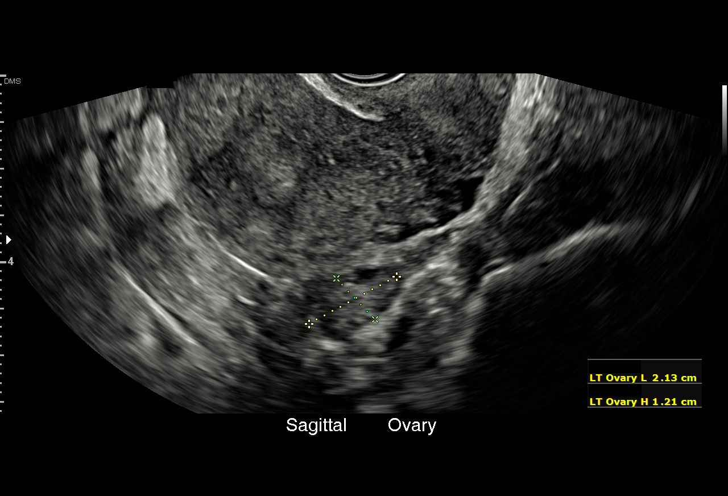
[im 17/19]
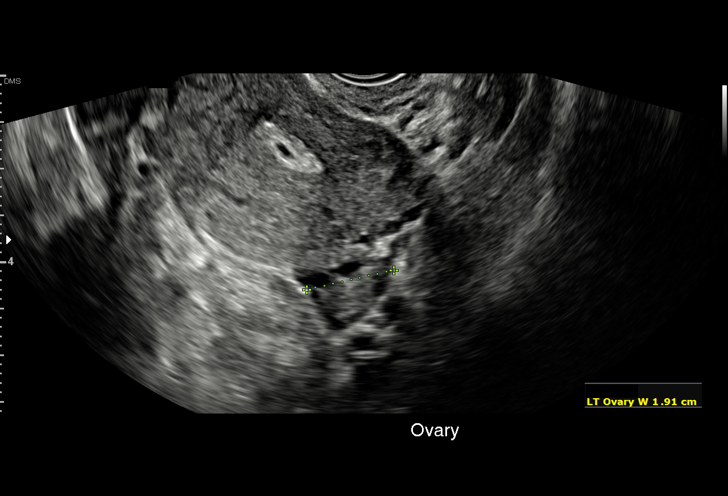
[im 18/19]
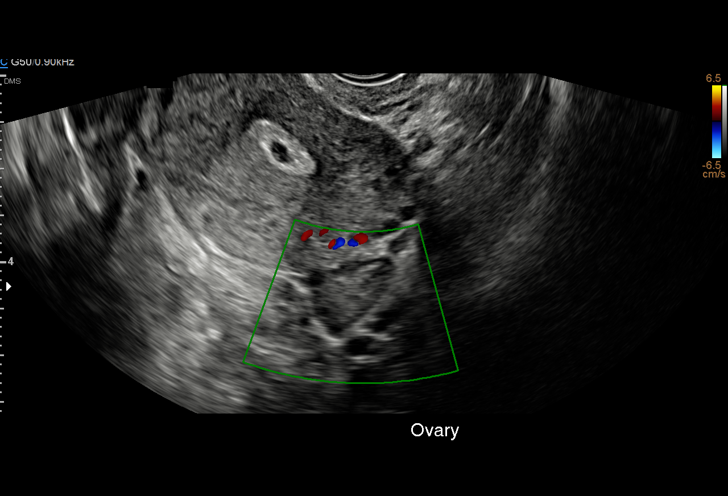
[im 19/19]
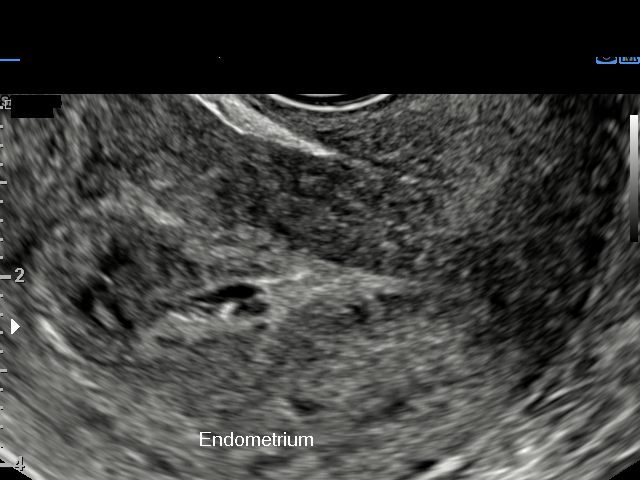

[16 of 19 positions shown; findings below may reference images not displayed]

FINDINGS: Intrauterine gestational sac: None visualized

Yolk sac:  Not visualized

Embryo:  Not visualized

Cardiac Activity: Not visualized

Heart Rate:  bpm

MSD:   mm    w     d

CRL:     mm    w  d                  US EDC:

Subchorionic hemorrhage:  None visualized.

Maternal uterus/adnexae: Endometrium is thickened, measuring 21 mm
and heterogeneous. Cannot explain pertain products of conception. No
free fluid or adnexal mass.
IMPRESSION: No intrauterine pregnancy visualized. Thickened, heterogeneous
endometrium. Findings concerning for retained products of
conception.
